# Patient Record
Sex: Female | Born: 1985 | Race: White | Hispanic: No | Marital: Single | Smoking: Former smoker
Health system: Southern US, Community
[De-identification: ages and names within clinical notes are randomized; demographics above are authoritative.]

## PROBLEM LIST (undated history)

## (undated) ENCOUNTER — Inpatient Hospital Stay (HOSPITAL_COMMUNITY): Payer: Self-pay

## (undated) DIAGNOSIS — F5002 Anorexia nervosa, binge eating/purging type: Secondary | ICD-10-CM

## (undated) DIAGNOSIS — K900 Celiac disease: Secondary | ICD-10-CM

## (undated) DIAGNOSIS — F419 Anxiety disorder, unspecified: Secondary | ICD-10-CM

## (undated) DIAGNOSIS — F329 Major depressive disorder, single episode, unspecified: Secondary | ICD-10-CM

## (undated) DIAGNOSIS — F32A Depression, unspecified: Secondary | ICD-10-CM

## (undated) DIAGNOSIS — C73 Malignant neoplasm of thyroid gland: Secondary | ICD-10-CM

## (undated) DIAGNOSIS — E282 Polycystic ovarian syndrome: Secondary | ICD-10-CM

## (undated) DIAGNOSIS — F4310 Post-traumatic stress disorder, unspecified: Secondary | ICD-10-CM

## (undated) DIAGNOSIS — F1021 Alcohol dependence, in remission: Secondary | ICD-10-CM

## (undated) HISTORY — DX: Post-traumatic stress disorder, unspecified: F43.10

## (undated) HISTORY — DX: Celiac disease: K90.0

## (undated) HISTORY — DX: Anorexia nervosa, binge eating/purging type: F50.02

## (undated) HISTORY — PX: TONSILLECTOMY: SUR1361

## (undated) HISTORY — DX: Alcohol dependence, in remission: F10.21

---

## 2009-09-25 DIAGNOSIS — C73 Malignant neoplasm of thyroid gland: Secondary | ICD-10-CM

## 2009-09-25 HISTORY — PX: THYROIDECTOMY: SHX17

## 2009-09-25 HISTORY — DX: Malignant neoplasm of thyroid gland: C73

## 2014-11-23 ENCOUNTER — Encounter (HOSPITAL_COMMUNITY): Payer: Self-pay | Admitting: *Deleted

## 2014-11-23 ENCOUNTER — Inpatient Hospital Stay (HOSPITAL_COMMUNITY)
Admission: AD | Admit: 2014-11-23 | Discharge: 2014-11-23 | Disposition: A | Discharge status: Home / Self Care | Payer: Medicare Other | Source: Ambulatory Visit | Attending: Obstetrics and Gynecology | Admitting: Obstetrics and Gynecology

## 2014-11-23 ENCOUNTER — Inpatient Hospital Stay (HOSPITAL_COMMUNITY): Payer: Medicare Other

## 2014-11-23 DIAGNOSIS — Z87891 Personal history of nicotine dependence: Secondary | ICD-10-CM | POA: Insufficient documentation

## 2014-11-23 DIAGNOSIS — F329 Major depressive disorder, single episode, unspecified: Secondary | ICD-10-CM | POA: Insufficient documentation

## 2014-11-23 DIAGNOSIS — O99281 Endocrine, nutritional and metabolic diseases complicating pregnancy, first trimester: Secondary | ICD-10-CM | POA: Insufficient documentation

## 2014-11-23 DIAGNOSIS — O26899 Other specified pregnancy related conditions, unspecified trimester: Secondary | ICD-10-CM

## 2014-11-23 DIAGNOSIS — Z85850 Personal history of malignant neoplasm of thyroid: Secondary | ICD-10-CM | POA: Insufficient documentation

## 2014-11-23 DIAGNOSIS — F42 Obsessive-compulsive disorder: Secondary | ICD-10-CM | POA: Insufficient documentation

## 2014-11-23 DIAGNOSIS — R109 Unspecified abdominal pain: Secondary | ICD-10-CM | POA: Diagnosis present

## 2014-11-23 DIAGNOSIS — Z3A08 8 weeks gestation of pregnancy: Secondary | ICD-10-CM | POA: Diagnosis not present

## 2014-11-23 DIAGNOSIS — E039 Hypothyroidism, unspecified: Secondary | ICD-10-CM | POA: Insufficient documentation

## 2014-11-23 DIAGNOSIS — O9989 Other specified diseases and conditions complicating pregnancy, childbirth and the puerperium: Secondary | ICD-10-CM

## 2014-11-23 DIAGNOSIS — O208 Other hemorrhage in early pregnancy: Secondary | ICD-10-CM | POA: Insufficient documentation

## 2014-11-23 HISTORY — DX: Anxiety disorder, unspecified: F41.9

## 2014-11-23 HISTORY — DX: Malignant neoplasm of thyroid gland: C73

## 2014-11-23 HISTORY — DX: Major depressive disorder, single episode, unspecified: F32.9

## 2014-11-23 HISTORY — DX: Depression, unspecified: F32.A

## 2014-11-23 HISTORY — DX: Polycystic ovarian syndrome: E28.2

## 2014-11-23 LAB — WET PREP, GENITAL
CLUE CELLS WET PREP: NONE SEEN
TRICH WET PREP: NONE SEEN
Yeast Wet Prep HPF POC: NONE SEEN

## 2014-11-23 LAB — CBC
HCT: 36.9 % (ref 36.0–46.0)
Hemoglobin: 12.6 g/dL (ref 12.0–15.0)
MCH: 29.1 pg (ref 26.0–34.0)
MCHC: 34.1 g/dL (ref 30.0–36.0)
MCV: 85.2 fL (ref 78.0–100.0)
PLATELETS: 239 10*3/uL (ref 150–400)
RBC: 4.33 MIL/uL (ref 3.87–5.11)
RDW: 13.1 % (ref 11.5–15.5)
WBC: 9.5 10*3/uL (ref 4.0–10.5)

## 2014-11-23 LAB — POCT PREGNANCY, URINE: Preg Test, Ur: POSITIVE — AB

## 2014-11-23 LAB — TSH: TSH: 0.085 u[IU]/mL — AB (ref 0.350–4.500)

## 2014-11-23 LAB — URINALYSIS, ROUTINE W REFLEX MICROSCOPIC
Bilirubin Urine: NEGATIVE
GLUCOSE, UA: NEGATIVE mg/dL
HGB URINE DIPSTICK: NEGATIVE
Ketones, ur: NEGATIVE mg/dL
LEUKOCYTES UA: NEGATIVE
Nitrite: NEGATIVE
PH: 6 (ref 5.0–8.0)
Protein, ur: NEGATIVE mg/dL
Specific Gravity, Urine: 1.025 (ref 1.005–1.030)
Urobilinogen, UA: 0.2 mg/dL (ref 0.0–1.0)

## 2014-11-23 LAB — HCG, QUANTITATIVE, PREGNANCY: HCG, BETA CHAIN, QUANT, S: 118602 m[IU]/mL — AB (ref <5)

## 2014-11-23 NOTE — Discharge Instructions (Signed)
First Trimester of Pregnancy The first trimester of pregnancy is from week 1 until the end of week 12 (months 1 through 3). During this time, your baby will begin to develop inside you. At 6-8 weeks, the eyes and face are formed, and the heartbeat can be seen on ultrasound. At the end of 12 weeks, all the baby's organs are formed. Prenatal care is all the medical care you receive before the birth of your baby. Make sure you get good prenatal care and follow all of your doctor's instructions. HOME CARE  Medicines  Take medicine only as told by your doctor. Some medicines are safe and some are not during pregnancy.  Take your prenatal vitamins as told by your doctor.  Take medicine that helps you poop (stool softener) as needed if your doctor says it is okay. Diet  Eat regular, healthy meals.  Your doctor will tell you the amount of weight gain that is right for you.  Avoid raw meat and uncooked cheese.  If you feel sick to your stomach (nauseous) or throw up (vomit):  Eat 4 or 5 small meals a day instead of 3 large meals.  Try eating a few soda crackers.  Drink liquids between meals instead of during meals.  If you have a hard time pooping (constipation):  Eat high-fiber foods like fresh vegetables, fruit, and whole grains.  Drink enough fluids to keep your pee (urine) clear or pale yellow. Activity and Exercise  Exercise only as told by your doctor. Stop exercising if you have cramps or pain in your lower belly (abdomen) or low back.  Try to avoid standing for long periods of time. Move your legs often if you must stand in one place for a long time.  Avoid heavy lifting.  Wear low-heeled shoes. Sit and stand up straight.  You can have sex unless your doctor tells you not to. Relief of Pain or Discomfort  Wear a good support bra if your breasts are sore.  Take warm water baths (sitz baths) to soothe pain or discomfort caused by hemorrhoids. Use hemorrhoid cream if your  doctor says it is okay.  Rest with your legs raised if you have leg cramps or low back pain.  Wear support hose if you have puffy, bulging veins (varicose veins) in your legs. Raise (elevate) your feet for 15 minutes, 3-4 times a day. Limit salt in your diet. Prenatal Care  Schedule your prenatal visits by the twelfth week of pregnancy.  Write down your questions. Take them to your prenatal visits.  Keep all your prenatal visits as told by your doctor. Safety  Wear your seat belt at all times when driving.  Make a list of emergency phone numbers. The list should include numbers for family, friends, the hospital, and police and fire departments. General Tips  Ask your doctor for a referral to a local prenatal class. Begin classes no later than at the start of month 6 of your pregnancy.  Ask for help if you need counseling or help with nutrition. Your doctor can give you advice or tell you where to go for help.  Do not use hot tubs, steam rooms, or saunas.  Do not douche or use tampons or scented sanitary pads.  Do not cross your legs for long periods of time.  Avoid litter boxes and soil used by cats.  Avoid all smoking, herbs, and alcohol. Avoid drugs not approved by your doctor.  Visit your dentist. At home, brush your teeth   with a soft toothbrush. Be gentle when you floss. GET HELP IF:  You are dizzy.  You have mild cramps or pressure in your lower belly.  You have a nagging pain in your belly area.  You continue to feel sick to your stomach, throw up, or have watery poop (diarrhea).  You have a bad smelling fluid coming from your vagina.  You have pain with peeing (urination).  You have increased puffiness (swelling) in your face, hands, legs, or ankles. GET HELP RIGHT AWAY IF:   You have a fever.  You are leaking fluid from your vagina.  You have spotting or bleeding from your vagina.  You have very bad belly cramping or pain.  You gain or lose weight  rapidly.  You throw up blood. It may look like coffee grounds.  You are around people who have German measles, fifth disease, or chickenpox.  You have a very bad headache.  You have shortness of breath.  You have any kind of trauma, such as from a fall or a car accident. Document Released: 02/28/2008 Document Revised: 01/26/2014 Document Reviewed: 07/22/2013 ExitCare Patient Information 2015 [employer]. This information is not intended to replace advice given to you by your health care provider. Make sure you discuss any questions you have with your health care provider.  

## 2014-11-23 NOTE — MAU Provider Note (Signed)
History     CSN: [id number]  Arrival date and time: 11/23/14 1612   First Provider Initiated Contact with Patient 11/23/14 1843      Chief Complaint  Patient presents with  . Possible Pregnancy  . Abdominal Cramping   HPI   Ms. Brittney Chase is a 29 y.o. female G1P0 at [redacted]w[redacted]d who presents with abdominal cramping. She is pregnant and wants to make sure her baby is ok. She also has some concerns regarding some of the medications she is taking.  She is concerned because she had a Thyroidectomy at age 75 due to thyroid CA and is taking medication for this.   She denies vaginal bleeding.   She is currently taking 200 mg of Zoloft daily for depression/ OCD.  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    OB History    Gravida Para Term Preterm AB TAB SAB Ectopic Multiple Living   1               Past Medical History  Diagnosis Date  . PCOS (polycystic ovarian syndrome)   . Papillary carcinoma of thyroid 2011  . Depression   . Anxiety     Past Surgical History  Procedure Laterality Date  . Thyroidectomy  2011    History reviewed. No pertinent family history.  History  Substance Use Topics  . Smoking status: Former Smoker    Quit date: 11/09/2014  . Smokeless tobacco: Never Used  . Alcohol Use: No    Allergies:  Allergies  Allergen Reactions  . Ambien [Zolpidem Tartrate] Other (See Comments)    Causes hallucinations.  . Bacitracin Hives and Itching  . Percocet [Oxycodone-Acetaminophen] Hives, Nausea Only and Swelling  . Shellfish Allergy Hives, Nausea Only and Swelling  . Sudafed [Pseudoephedrine Hcl] Other (See Comments)    Causes hallucinations.    Prescriptions prior to admission  Medication Sig Dispense Refill Last Dose  . Bioflavonoid Products (ESTER C PO) Take 500 mg by mouth at bedtime.   11/22/2014 at Unknown time  . Cyanocobalamin (VITAMIN B-12 PO) Place 1 each under the tongue at bedtime.   11/22/2014 at Unknown time  . docusate sodium (COLACE) 100  MG capsule Take 100 mg by mouth at bedtime.   11/22/2014 at Unknown time  . Prenatal Vit-Fe Fumarate-FA (PRENATAL MULTIVITAMIN) TABS tablet Take 1 tablet by mouth at bedtime.   11/22/2014 at Unknown time  . Probiotic Product (ACIDOPHILUS PEARLS) CAPS Take 2 each by mouth at bedtime.   11/22/2014 at Unknown time  . sertraline (ZOLOFT) 100 MG tablet Take 200 mg by mouth at bedtime.   11/22/2014 at Unknown time  . thyroid (ARMOUR) 65 MG tablet Take 65 mg by mouth 5 (five) times daily.   11/23/2014 at Unknown time   Results for orders placed or performed during the hospital encounter of 11/23/14 (from the past 48 hour(s))  Urinalysis, Routine w reflex microscopic     Status: None   Collection Time: 11/23/14  4:34 PM  Result Value Ref Range   Color, Urine YELLOW YELLOW   APPearance CLEAR CLEAR   Specific Gravity, Urine 1.025 1.005 - 1.030   pH 6.0 5.0 - 8.0   Glucose, UA NEGATIVE NEGATIVE mg/dL   Hgb urine dipstick NEGATIVE NEGATIVE   Bilirubin Urine NEGATIVE NEGATIVE   Ketones, ur NEGATIVE NEGATIVE mg/dL   Protein, ur NEGATIVE NEGATIVE mg/dL   Urobilinogen, UA 0.2 0.0 - 1.0 mg/dL   Nitrite NEGATIVE NEGATIVE   Leukocytes, UA NEGATIVE NEGATIVE  Comment: MICROSCOPIC NOT DONE ON URINES WITH NEGATIVE PROTEIN, BLOOD, LEUKOCYTES, NITRITE, OR GLUCOSE <1000 mg/dL.  Pregnancy, urine POC     Status: Abnormal   Collection Time: 11/23/14  4:45 PM  Result Value Ref Range   Preg Test, Ur POSITIVE (A) NEGATIVE    Comment:        THE SENSITIVITY OF THIS METHODOLOGY IS >24 mIU/mL   CBC     Status: None   Collection Time: 11/23/14  7:10 PM  Result Value Ref Range   WBC 9.5 4.0 - 10.5 K/uL   RBC 4.33 3.87 - 5.11 MIL/uL   Hemoglobin 12.6 12.0 - 15.0 g/dL   HCT 36.9 36.0 - 46.0 %   MCV 85.2 78.0 - 100.0 fL   MCH 29.1 26.0 - 34.0 pg   MCHC 34.1 30.0 - 36.0 g/dL   RDW 13.1 11.5 - 15.5 %   Platelets 239 150 - 400 K/uL  hCG, quantitative, pregnancy     Status: Abnormal   Collection Time: 11/23/14  7:10 PM   Result Value Ref Range   hCG, Beta Chain, Quant, S [id number] (H) <5 mIU/mL    Comment:          GEST. AGE      CONC.  (mIU/mL)   <=1 WEEK        5 - 50     2 WEEKS       50 - 500     3 WEEKS       100 - 10,000     4 WEEKS     1,000 - 30,000     5 WEEKS     3,500 - 115,000   6-8 WEEKS     12,000 - 270,000    12 WEEKS     15,000 - 220,000        FEMALE AND NON-PREGNANT FEMALE:     LESS THAN 5 mIU/mL   Wet prep, genital     Status: Abnormal   Collection Time: 11/23/14  7:25 PM  Result Value Ref Range   Yeast Wet Prep HPF POC NONE SEEN NONE SEEN   Trich, Wet Prep NONE SEEN NONE SEEN   Clue Cells Wet Prep HPF POC NONE SEEN NONE SEEN   WBC, Wet Prep HPF POC MODERATE (A) NONE SEEN    Comment: FEW BACTERIA SEEN    Review of Systems  Constitutional: Positive for chills. Negative for fever.  Gastrointestinal: Positive for nausea and abdominal pain (Lower abdominal pain/ cramping ). Negative for vomiting.   Physical Exam   Blood pressure 132/72, pulse 89, temperature 99.4 F (37.4 C), temperature source Oral, resp. rate 18, height 5\' 4"  (1.626 m), weight 148 lb (67.132 kg), last menstrual period 09/25/2014.  Physical Exam  Constitutional: She is oriented to person, place, and time. She appears well-developed and well-nourished. No distress.  HENT:  Head: Normocephalic.  Eyes: Pupils are equal, round, and reactive to light.  Neck: Neck supple.  Respiratory: Effort normal. No respiratory distress.  GI: Soft. She exhibits no distension. There is tenderness in the right lower quadrant, periumbilical area, suprapubic area and left lower quadrant. There is no rigidity, no rebound and no guarding.  Genitourinary:  Speculum exam: Vagina - Small amount of creamy, white discharge, no odor Cervix - No contact bleeding Bimanual exam: Cervix closed, posterior, no CMT  Uterus non tender, enlarged  Adnexa non tender, no masses bilaterally GC/Chlam, wet prep done Chaperone present for exam.    Musculoskeletal: Normal  range of motion.  Neurological: She is alert and oriented to person, place, and time.  Skin: Skin is warm. She is not diaphoretic.  Psychiatric: Her behavior is normal.    MAU Course  Procedures  None  MDM UA CBC ABO Quant Free T3 Free T4 TSH  HIV  Wet prep GC [place]   Report given to Kerry Hough NP who resumes care of the patient.   Patient takes Armour for hypothyroidism.She took synthroid for years and it made her feel terrible. She is not willing to take synthroid at this time.   Darrelyn Hillock Rasch, NP 11/23/2014 7:47 PM  2000 - Care assumed from Noni Saupe, NP Patient in [place].   US Ob Comp Less 14 Wks  11/23/2014   CLINICAL DATA:  Positive pregnancy test, abdominal pain  EXAM: OBSTETRIC <14 WK [place] AND TRANSVAGINAL OB US  TECHNIQUE: Both transabdominal and transvaginal ultrasound examinations were performed for complete evaluation of the gestation as well as the maternal uterus, adnexal regions, and pelvic cul-de-sac. Transvaginal technique was performed to assess early pregnancy.  COMPARISON:  None.  FINDINGS: Intrauterine gestational sac: Visualized/normal in shape.  Yolk sac:  Visualized  Embryo:  Visualized  Cardiac Activity: Visualized  Heart Rate: 158  bpm  CRL:  17  mm   8 w   2 d                  [place] EDC: 07/03/15  Maternal uterus/adnexae: The ovaries are normal in appearance. Small subchorionic hemorrhage.  IMPRESSION: Single live intrauterine gestation with concordant measurements by crown-rump length compared to assigned gestational age of [redacted] weeks 3 days by LMP.   Electronically Signed   By: Conchita Paris M.D.   On: 11/23/2014 20:51   US Ob Transvaginal  11/23/2014   CLINICAL DATA:  Positive pregnancy test, abdominal pain  EXAM: OBSTETRIC <14 WK [place] AND TRANSVAGINAL OB US  TECHNIQUE: Both transabdominal and transvaginal ultrasound examinations were performed for complete evaluation of the gestation as well as the maternal uterus, adnexal  regions, and pelvic cul-de-sac. Transvaginal technique was performed to assess early pregnancy.  COMPARISON:  None.  FINDINGS: Intrauterine gestational sac: Visualized/normal in shape.  Yolk sac:  Visualized  Embryo:  Visualized  Cardiac Activity: Visualized  Heart Rate: 158  bpm  CRL:  17  mm   8 w   2 d                  [place] EDC: 07/03/15  Maternal uterus/adnexae: The ovaries are normal in appearance. Small subchorionic hemorrhage.  IMPRESSION: Single live intrauterine gestation with concordant measurements by crown-rump length compared to assigned gestational age of [redacted] weeks 3 days by LMP.   Electronically Signed   By: Conchita Paris M.D.   On: 11/23/2014 20:51     Assessment and Plan  A: SIUP at [redacted]w[redacted]d Small subchorionic hemorrhage H/O Thyroidectomy H/O alcohol abuse Abdominal pain in pregnancy  P: Discharge home Tylenol advised PRN for pain  Patient [facility] for prenatal [facility]. They will call her with an appointment Thyroid labs and ABO/Rh pending. Patient is not bleeding today.  Pregnancy confirmation letter given First trimester warning signs discussed Patient may return to MAU as needed or if her condition were to change or worsen   Luvenia Redden, PA-C  11/23/2014 8:58 PM

## 2014-11-23 NOTE — MAU Note (Addendum)
Learned about a wk and a half ago that she was 7 wks ago. Moved back out here from [place]; living with family- wants to make sure baby is ok. Had thyroidectomy 5 yrs ago. Has been hard to control  and regulate, was told this made her high risk.  Is on a high dose of Zoloft.  This is her first.

## 2014-11-24 LAB — ABO/RH: ABO/RH(D): O POS

## 2014-11-24 LAB — T4, FREE: FREE T4: 0.75 ng/dL — AB (ref 0.80–1.80)

## 2014-11-24 LAB — HIV ANTIBODY (ROUTINE TESTING W REFLEX): HIV Screen 4th Generation wRfx: NONREACTIVE

## 2014-11-25 LAB — GC/CHLAMYDIA PROBE AMP (~~LOC~~) NOT AT ARMC
Chlamydia: NEGATIVE
NEISSERIA GONORRHEA: NEGATIVE

## 2014-11-25 LAB — T3, FREE: T3, Free: 4.1 pg/mL (ref 2.0–4.4)

## 2014-11-25 LAB — T3, REVERSE: T3, Reverse: 15 ng/dL (ref 8–25)

## 2014-12-17 ENCOUNTER — Encounter: Payer: Self-pay | Admitting: Obstetrics & Gynecology

## 2014-12-17 ENCOUNTER — Ambulatory Visit (INDEPENDENT_AMBULATORY_CARE_PROVIDER_SITE_OTHER): Payer: Medicare Other | Admitting: Obstetrics & Gynecology

## 2014-12-17 ENCOUNTER — Other Ambulatory Visit (HOSPITAL_COMMUNITY)
Admission: RE | Admit: 2014-12-17 | Discharge: 2014-12-17 | Disposition: A | Discharge status: Home / Self Care | Payer: Medicare Other | Source: Ambulatory Visit | Attending: Obstetrics & Gynecology | Admitting: Obstetrics & Gynecology

## 2014-12-17 ENCOUNTER — Other Ambulatory Visit: Payer: Self-pay | Admitting: Obstetrics & Gynecology

## 2014-12-17 VITALS — BP 101/70 | HR 73 | Temp 98.4°F | Wt 157.9 lb

## 2014-12-17 DIAGNOSIS — E039 Hypothyroidism, unspecified: Secondary | ICD-10-CM | POA: Diagnosis not present

## 2014-12-17 DIAGNOSIS — Z85850 Personal history of malignant neoplasm of thyroid: Secondary | ICD-10-CM

## 2014-12-17 DIAGNOSIS — R87619 Unspecified abnormal cytological findings in specimens from cervix uteri: Secondary | ICD-10-CM | POA: Insufficient documentation

## 2014-12-17 DIAGNOSIS — Z3401 Encounter for supervision of normal first pregnancy, first trimester: Secondary | ICD-10-CM

## 2014-12-17 DIAGNOSIS — Z1151 Encounter for screening for human papillomavirus (HPV): Secondary | ICD-10-CM | POA: Insufficient documentation

## 2014-12-17 DIAGNOSIS — Z124 Encounter for screening for malignant neoplasm of cervix: Secondary | ICD-10-CM | POA: Insufficient documentation

## 2014-12-17 DIAGNOSIS — Z3682 Encounter for antenatal screening for nuchal translucency: Secondary | ICD-10-CM

## 2014-12-17 DIAGNOSIS — F5002 Anorexia nervosa, binge eating/purging type: Secondary | ICD-10-CM | POA: Insufficient documentation

## 2014-12-17 DIAGNOSIS — Z23 Encounter for immunization: Secondary | ICD-10-CM

## 2014-12-17 LAB — POCT URINALYSIS DIP (DEVICE)
BILIRUBIN URINE: NEGATIVE
Glucose, UA: NEGATIVE mg/dL
Hgb urine dipstick: NEGATIVE
Ketones, ur: NEGATIVE mg/dL
LEUKOCYTES UA: NEGATIVE
Nitrite: NEGATIVE
Protein, ur: NEGATIVE mg/dL
Urobilinogen, UA: 0.2 mg/dL (ref 0.0–1.0)
pH: 5.5 (ref 5.0–8.0)

## 2014-12-17 NOTE — Addendum Note (Signed)
Addended by: Excell Seltzer on: 12/17/2014 01:42 PM   Modules accepted: Orders

## 2014-12-17 NOTE — Progress Notes (Signed)
First Trimester Screen with Garrett 12/24/14 @1130a .

## 2014-12-17 NOTE — Progress Notes (Signed)
Nutrition note: 1st visit consult Pt has gained 13.9# @ [redacted]w[redacted]d, which is > expected. Pt reports this is due to her lack of thyroid & now taking T4. Pt reports eating 2 meals & 2-3 snacks/d. Pt is taking a PNV as well as multiple supplements (including B12, fish oil, probiotic). Pt reports having N&V and that eating helps decrease this. Pt reports no heartburn. Pt is allergic to shellfish & has celiac dx (pt reports biopsy confirmation when ~16y). Pt received verbal & written education on general nutrition during pregnancy. Discussed tips to decrease N&V. Discussed wt gain goals of 25-35# or 1#/wk. Pt agrees to continue taking PNV. Pt has [place] in [facility] & plans to BF. F/u as needed Vladimir Faster, MS, RD, LDN, [facility]

## 2014-12-17 NOTE — Patient Instructions (Signed)
First Trimester of Pregnancy The first trimester of pregnancy is from week 1 until the end of week 12 (months 1 through 3). A week after a sperm fertilizes an egg, the egg will implant on the wall of the uterus. This embryo will begin to develop into a baby. Genes from you and your partner are forming the baby. The female genes determine whether the baby is a boy or a girl. At 6-8 weeks, the eyes and face are formed, and the heartbeat can be seen on ultrasound. At the end of 12 weeks, all the baby's organs are formed.  Now that you are pregnant, you will want to do everything you can to have a healthy baby. Two of the most important things are to get good prenatal care and to follow your health care provider's instructions. Prenatal care is all the medical care you receive before the baby's birth. This care will help prevent, find, and treat any problems during the pregnancy and childbirth. BODY CHANGES Your body goes through many changes during pregnancy. The changes vary from woman to woman.   You may gain or lose a couple of pounds at first.  You may feel sick to your stomach (nauseous) and throw up (vomit). If the vomiting is uncontrollable, call your health care provider.  You may tire easily.  You may develop headaches that can be relieved by medicines approved by your health care provider.  You may urinate more often. Painful urination may mean you have a bladder infection.  You may develop heartburn as a result of your pregnancy.  You may develop constipation because certain hormones are causing the muscles that push waste through your intestines to slow down.  You may develop hemorrhoids or swollen, bulging veins (varicose veins).  Your breasts may begin to grow larger and become tender. Your nipples may stick out more, and the tissue that surrounds them (areola) may become darker.  Your gums may bleed and may be sensitive to brushing and flossing.  Dark spots or blotches (chloasma,  mask of pregnancy) may develop on your face. This will likely fade after the baby is born.  Your menstrual periods will stop.  You may have a loss of appetite.  You may develop cravings for certain kinds of food.  You may have changes in your emotions from day to day, such as being excited to be pregnant or being concerned that something may go wrong with the pregnancy and baby.  You may have more vivid and strange dreams.  You may have changes in your hair. These can include thickening of your hair, rapid growth, and changes in texture. Some women also have hair loss during or after pregnancy, or hair that feels dry or thin. Your hair will most likely return to normal after your baby is born. WHAT TO EXPECT AT YOUR PRENATAL VISITS During a routine prenatal visit:  You will be weighed to make sure you and the baby are growing normally.  Your blood pressure will be taken.  Your abdomen will be measured to track your baby's growth.  The fetal heartbeat will be listened to starting around week 10 or 12 of your pregnancy.  Test results from any previous visits will be discussed. Your health care provider may ask you:  How you are feeling.  If you are feeling the baby move.  If you have had any abnormal symptoms, such as leaking fluid, bleeding, severe headaches, or abdominal cramping.  If you have any questions. Other tests   that may be performed during your first trimester include:  Blood tests to find your blood type and to check for the presence of any previous infections. They will also be used to check for low iron levels (anemia) and Rh antibodies. Later in the pregnancy, blood tests for diabetes will be done along with other tests if problems develop.  Urine tests to check for infections, diabetes, or protein in the urine.  An ultrasound to confirm the proper growth and development of the baby.  An amniocentesis to check for possible genetic problems.  Fetal screens for  spina bifida and Down syndrome.  You may need other tests to make sure you and the baby are doing well. HOME CARE INSTRUCTIONS  Medicines  Follow your health care provider's instructions regarding medicine use. Specific medicines may be either safe or unsafe to take during pregnancy.  Take your prenatal vitamins as directed.  If you develop constipation, try taking a stool softener if your health care provider approves. Diet  Eat regular, well-balanced meals. Choose a variety of foods, such as meat or vegetable-based protein, fish, milk and low-fat dairy products, vegetables, fruits, and whole grain breads and cereals. Your health care provider will help you determine the amount of weight gain that is right for you.  Avoid raw meat and uncooked cheese. These carry germs that can cause birth defects in the baby.  Eating four or five small meals rather than three large meals a day may help relieve nausea and vomiting. If you start to feel nauseous, eating a few soda crackers can be helpful. Drinking liquids between meals instead of during meals also seems to help nausea and vomiting.  If you develop constipation, eat more high-fiber foods, such as fresh vegetables or fruit and whole grains. Drink enough fluids to keep your urine clear or pale yellow. Activity and Exercise  Exercise only as directed by your health care provider. Exercising will help you:  Control your weight.  Stay in shape.  Be prepared for labor and delivery.  Experiencing pain or cramping in the lower abdomen or low back is a good sign that you should stop exercising. Check with your health care provider before continuing normal exercises.  Try to avoid standing for long periods of time. Move your legs often if you must stand in one place for a long time.  Avoid heavy lifting.  Wear low-heeled shoes, and practice good posture.  You may continue to have sex unless your health care provider directs you  otherwise. Relief of Pain or Discomfort  Wear a good support bra for breast tenderness.   Take warm sitz baths to soothe any pain or discomfort caused by hemorrhoids. Use hemorrhoid cream if your health care provider approves.   Rest with your legs elevated if you have leg cramps or low back pain.  If you develop varicose veins in your legs, wear support hose. Elevate your feet for 15 minutes, 3-4 times a day. Limit salt in your diet. Prenatal Care  Schedule your prenatal visits by the twelfth week of pregnancy. They are usually scheduled monthly at first, then more often in the last 2 months before delivery.  Write down your questions. Take them to your prenatal visits.  Keep all your prenatal visits as directed by your health care provider. Safety  Wear your seat belt at all times when driving.  Make a list of emergency phone numbers, including numbers for family, friends, the hospital, and police and fire departments. General Tips    Ask your health care provider for a referral to a local prenatal education class. Begin classes no later than at the beginning of month 6 of your pregnancy.  Ask for help if you have counseling or nutritional needs during pregnancy. Your health care provider can offer advice or refer you to specialists for help with various needs.  Do not use hot tubs, steam rooms, or saunas.  Do not douche or use tampons or scented sanitary pads.  Do not cross your legs for long periods of time.  Avoid cat litter boxes and soil used by cats. These carry germs that can cause birth defects in the baby and possibly loss of the fetus by miscarriage or stillbirth.  Avoid all smoking, herbs, alcohol, and medicines not prescribed by your health care provider. Chemicals in these affect the formation and growth of the baby.  Schedule a dentist appointment. At home, brush your teeth with a soft toothbrush and be gentle when you floss. SEEK MEDICAL CARE IF:   You have  dizziness.  You have mild pelvic cramps, pelvic pressure, or nagging pain in the abdominal area.  You have persistent nausea, vomiting, or diarrhea.  You have a bad smelling vaginal discharge.  You have pain with urination.  You notice increased swelling in your face, hands, legs, or ankles. SEEK IMMEDIATE MEDICAL CARE IF:   You have a fever.  You are leaking fluid from your vagina.  You have spotting or bleeding from your vagina.  You have severe abdominal cramping or pain.  You have rapid weight gain or loss.  You vomit blood or material that looks like coffee grounds.  You are exposed to German measles and have never had them.  You are exposed to fifth disease or chickenpox.  You develop a severe headache.  You have shortness of breath.  You have any kind of trauma, such as from a fall or a car accident. Document Released: 09/05/2001 Document Revised: 01/26/2014 Document Reviewed: 07/22/2013 Exit[employer] Patient Information 2015 [employer]. This information is not intended to replace advice given to you by your health care provider. Make sure you discuss any questions you have with your health care provider.  
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
## 2014-12-17 NOTE — Progress Notes (Signed)
   Subjective:new OB    Brittney Chase is a G1P0 [redacted]w[redacted]d being seen today for her first obstetrical visit.  Her obstetrical history is significant for hypothyroid. Patient does intend to breast feed. Pregnancy history fully reviewed.  Patient reports no complaints.  Filed Vitals:   12/17/14 1026  BP: 101/70  Pulse: 73  Temp: 98.4 F (36.9 C)  Weight: 157 lb 14.4 oz (71.623 kg)    HISTORY: OB History  Gravida Para Term Preterm AB SAB TAB Ectopic Multiple Living  1             # Outcome Date GA Lbr Len/2nd Weight Sex Delivery Anes PTL Lv  1 Current              Past Medical History  Diagnosis Date  . PCOS (polycystic ovarian syndrome)   . Papillary carcinoma of thyroid 2011  . Depression   . Anxiety   . Celiac disease     when patient was 73  . Anorexia nervosa, binge eating/purging type     up until pregnancy   . History of alcoholism     quit two years ago  . PTSD (post-traumatic stress disorder)    Past Surgical History  Procedure Laterality Date  . Thyroidectomy  2011  . Tonsillectomy     Family History  Problem Relation Age of Onset  . Diabetes Mother     prediabetic  . Hyperlipidemia Mother   . Hypertension Mother   . Depression Mother   . Asthma Mother   . COPD Father   . Alcohol abuse Father   . Bipolar disorder Father   . Depression Father   . Hyperlipidemia Father   . Hypertension Father      Exam    Uterus:     Pelvic Exam:    Perineum: No Hemorrhoids   Vulva: normal   Vagina:  normal mucosa   pH:     Cervix: no lesions   Adnexa: normal adnexa   Bony Pelvis: average  System: Breast:  normal appearance, no masses or tenderness   Skin: normal coloration and turgor, no rashes    Neurologic: oriented, normal mood   Extremities: normal strength, tone, and muscle mass   HEENT thyroid without masses   Mouth/Teeth dental hygiene good   Neck supple and no masses   Cardiovascular: regular rate and rhythm, no murmurs or gallops   Respiratory:  appears well, vitals normal, no respiratory distress, acyanotic, normal RR   Abdomen: soft, non-tender; bowel sounds normal; no masses,  no organomegaly   Urinary: urethral meatus normal      Assessment:    Pregnancy: G1P0 Patient Active Problem List   Diagnosis Date Noted  . Supervision of normal first pregnancy in first trimester 12/17/2014  . History of thyroid cancer 12/17/2014  . Hypothyroidism (acquired) 12/17/2014  . Anorexia nervosa, binge eating/purging type         Plan:     Initial labs drawn. Prenatal vitamins. Problem list reviewed and updated. Genetic Screening discussed First Screen: ordered.  Ultrasound discussed; fetal survey: 18+WEEKS.  Follow up in 4 weeks. 50% of 30 min visit spent on counseling and coordination of care.  Has endocriniologist   Brittney Chase 12/17/2014

## 2014-12-18 LAB — PRENATAL PROFILE (SOLSTAS)
Antibody Screen: NEGATIVE
Basophils Absolute: 0 10*3/uL (ref 0.0–0.1)
Basophils Relative: 0 % (ref 0–1)
EOS PCT: 1 % (ref 0–5)
Eosinophils Absolute: 0.1 10*3/uL (ref 0.0–0.7)
HCT: 38.4 % (ref 36.0–46.0)
HEMOGLOBIN: 12.8 g/dL (ref 12.0–15.0)
HIV 1&2 Ab, 4th Generation: NONREACTIVE
Hepatitis B Surface Ag: NEGATIVE
LYMPHS ABS: 1.6 10*3/uL (ref 0.7–4.0)
Lymphocytes Relative: 19 % (ref 12–46)
MCH: 29.7 pg (ref 26.0–34.0)
MCHC: 33.3 g/dL (ref 30.0–36.0)
MCV: 89.1 fL (ref 78.0–100.0)
MONO ABS: 0.4 10*3/uL (ref 0.1–1.0)
MPV: 9.7 fL (ref 8.6–12.4)
Monocytes Relative: 5 % (ref 3–12)
Neutro Abs: 6.2 10*3/uL (ref 1.7–7.7)
Neutrophils Relative %: 75 % (ref 43–77)
Platelets: 233 10*3/uL (ref 150–400)
RBC: 4.31 MIL/uL (ref 3.87–5.11)
RDW: 15.2 % (ref 11.5–15.5)
RUBELLA: 1.69 {index} — AB (ref <0.90)
Rh Type: POSITIVE
WBC: 8.2 10*3/uL (ref 4.0–10.5)

## 2014-12-18 LAB — CULTURE, OB URINE
Colony Count: NO GROWTH
Organism ID, Bacteria: NO GROWTH

## 2014-12-19 LAB — PRESCRIPTION MONITORING PROFILE (19 PANEL)
Amphetamine/Meth: NEGATIVE ng/mL
BARBITURATE SCREEN, URINE: NEGATIVE ng/mL
Benzodiazepine Screen, Urine: NEGATIVE ng/mL
Buprenorphine, Urine: NEGATIVE ng/mL
CANNABINOID SCRN UR: NEGATIVE ng/mL
COCAINE METABOLITES: NEGATIVE ng/mL
Carisoprodol, Urine: NEGATIVE ng/mL
Creatinine, Urine: 14.85 mg/dL — ABNORMAL LOW (ref >20.0)
Fentanyl, Ur: NEGATIVE ng/mL
MDMA URINE: NEGATIVE ng/mL
METHAQUALONE SCREEN (URINE): NEGATIVE ng/mL
Meperidine, Ur: NEGATIVE ng/mL
Methadone Screen, Urine: NEGATIVE ng/mL
Nitrites, Initial: NEGATIVE ug/mL
Opiate Screen, Urine: NEGATIVE ng/mL
Oxycodone Screen, Ur: NEGATIVE ng/mL
PH URINE, INITIAL: 6.7 pH (ref 4.5–8.9)
PHENCYCLIDINE, UR: NEGATIVE ng/mL
Propoxyphene: NEGATIVE ng/mL
Tapentadol, urine: NEGATIVE ng/mL
Tramadol Scrn, Ur: NEGATIVE ng/mL
Zolpidem, Urine: NEGATIVE ng/mL

## 2014-12-21 LAB — CYTOLOGY - PAP

## 2014-12-24 ENCOUNTER — Other Ambulatory Visit (HOSPITAL_COMMUNITY): Payer: Medicare Other

## 2014-12-24 ENCOUNTER — Ambulatory Visit (HOSPITAL_COMMUNITY): Payer: Medicare Other

## 2014-12-24 ENCOUNTER — Encounter: Payer: Self-pay | Admitting: Obstetrics & Gynecology

## 2014-12-24 DIAGNOSIS — E039 Hypothyroidism, unspecified: Secondary | ICD-10-CM | POA: Insufficient documentation

## 2014-12-24 DIAGNOSIS — O99280 Endocrine, nutritional and metabolic diseases complicating pregnancy, unspecified trimester: Secondary | ICD-10-CM

## 2014-12-31 ENCOUNTER — Telehealth: Payer: Self-pay

## 2014-12-31 NOTE — Telephone Encounter (Signed)
-----   Message from Mallie Darting sent at 12/31/2014  3:13 PM EDT ----- Patient is coming in with Kennon Rounds on her ob appointment day. You still may want to call her, and inform her of this procedure ----- Message -----    From: Ronnell Freshwater Day, RN    Sent: 12/29/2014   8:42 AM      To: [facility] Admin Pool  Please schedule Colpo appt and send back to clinical pool. We will call the pt.  Thanks  ----- Message -----    From: Woodroe Mode, MD    Sent: 12/28/2014   4:45 PM      To: [facility] Clinical Pool  LSIL needs colposcopy
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
## 2015-01-14 ENCOUNTER — Encounter: Payer: Medicare Other | Admitting: Family Medicine

## 2015-01-14 ENCOUNTER — Encounter: Payer: Medicare Other | Admitting: Obstetrics and Gynecology

## 2015-09-28 ENCOUNTER — Encounter (HOSPITAL_COMMUNITY): Payer: Self-pay | Admitting: *Deleted

## 2015-12-26 IMAGING — US US OB TRANSVAGINAL
1 series · 14 of 28 positions shown · non-contrast
Comparison: None.

CLINICAL DATA: Positive pregnancy test, abdominal pain

EXAM:
OBSTETRIC <14 WK US AND TRANSVAGINAL OB US
TECHNIQUE: Both transabdominal and transvaginal ultrasound examinations were
performed for complete evaluation of the gestation as well as the
maternal uterus, adnexal regions, and pelvic cul-de-sac.
Transvaginal technique was performed to assess early pregnancy.

[Series 1: us ob comp less 14 wks · 14 of 80 slices shown]
[im 3/80]
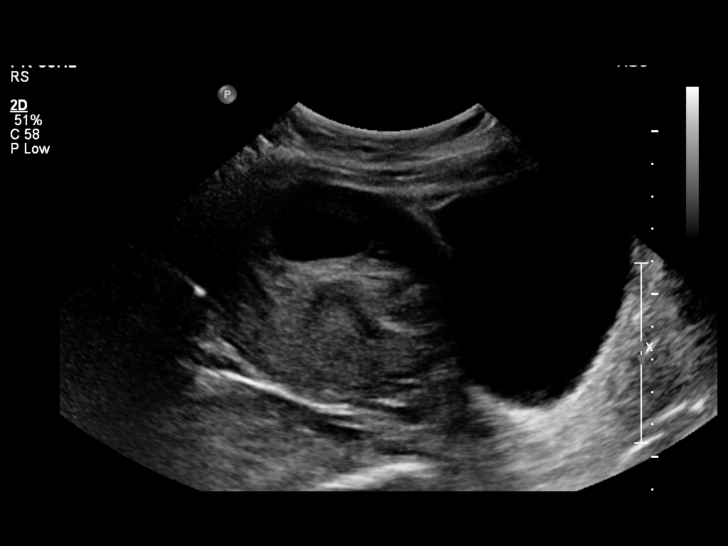
[im 9/80]
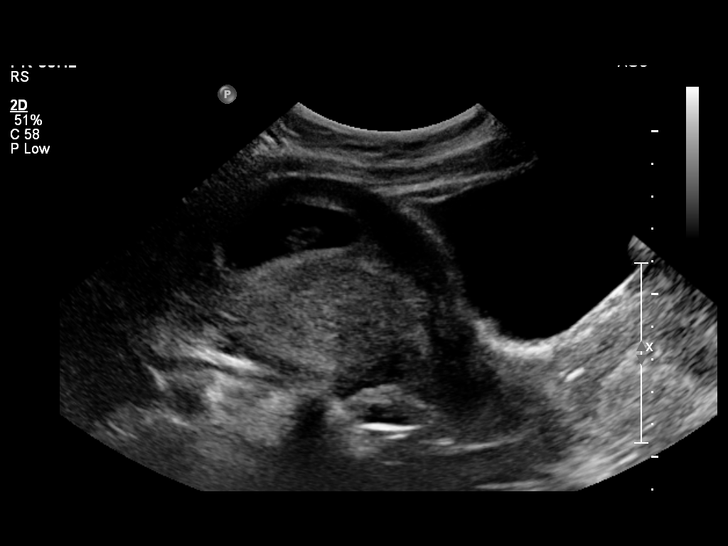
[im 15/80]
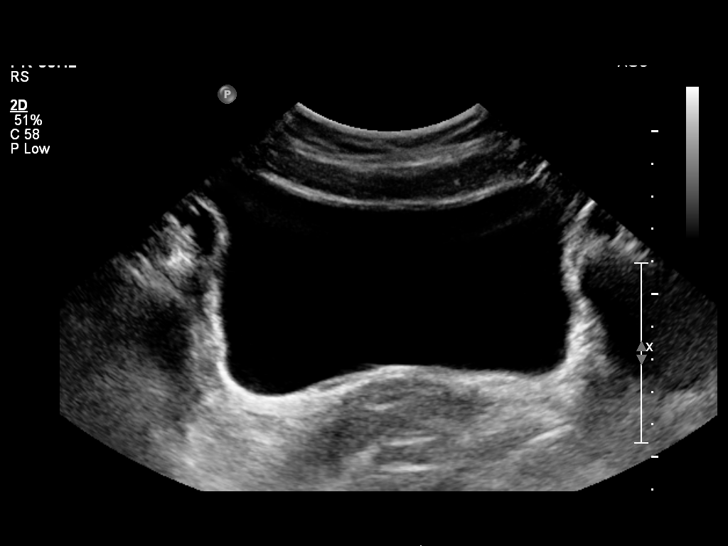
[im 21/80]
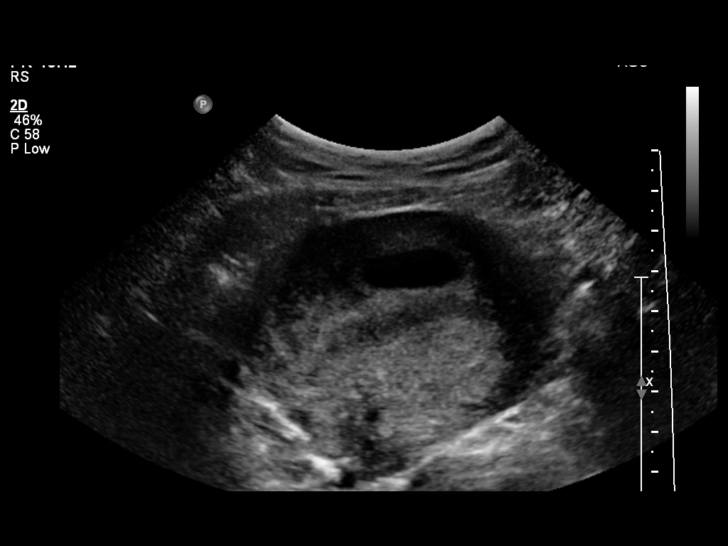
[im 27/80]
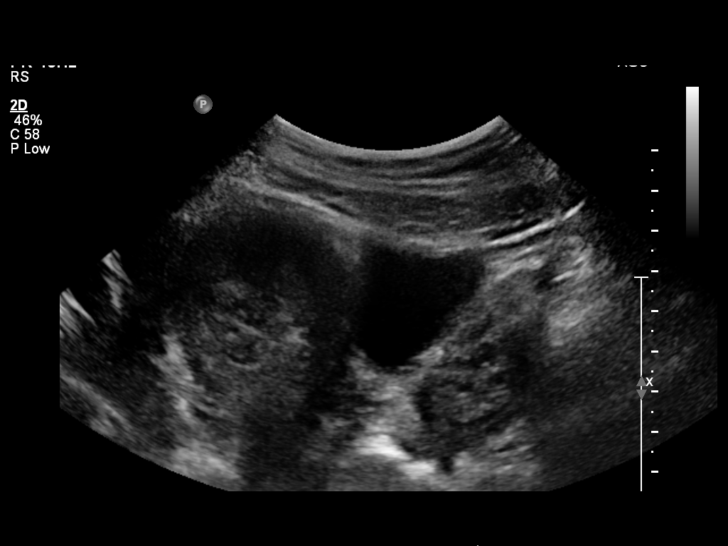
[im 33/80]
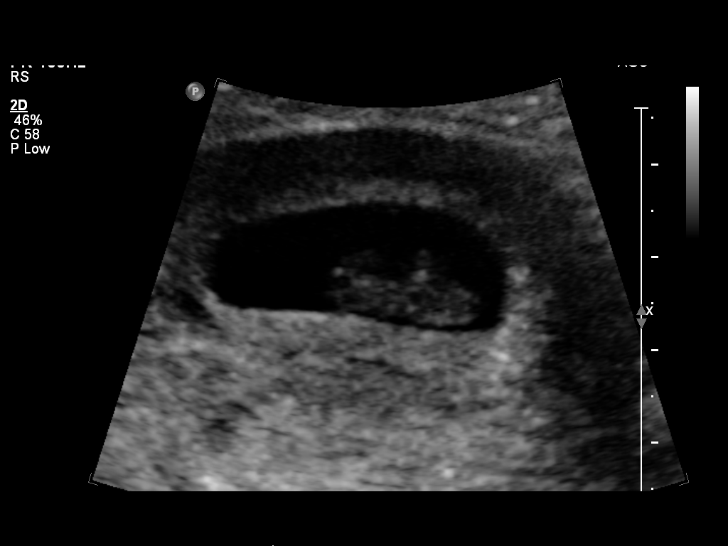
[im 39/80]
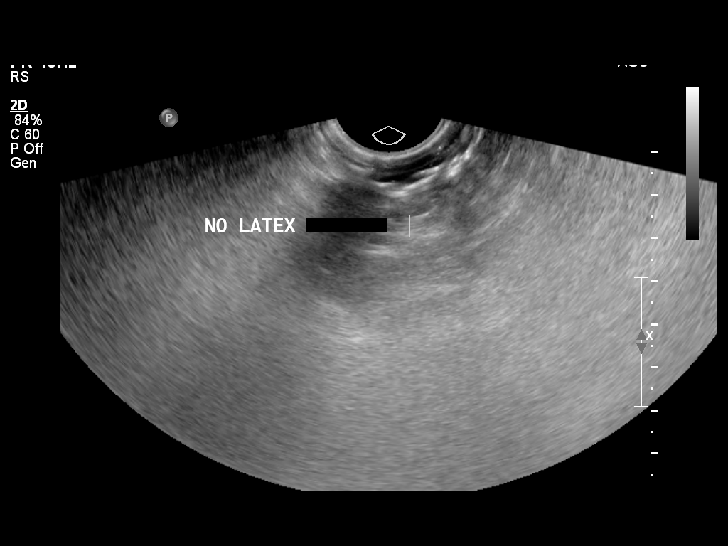
[im 44/80]
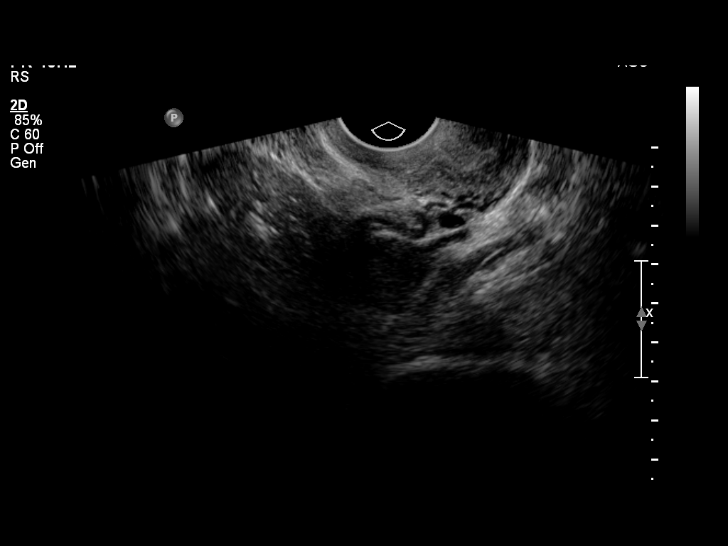
[im 50/80]
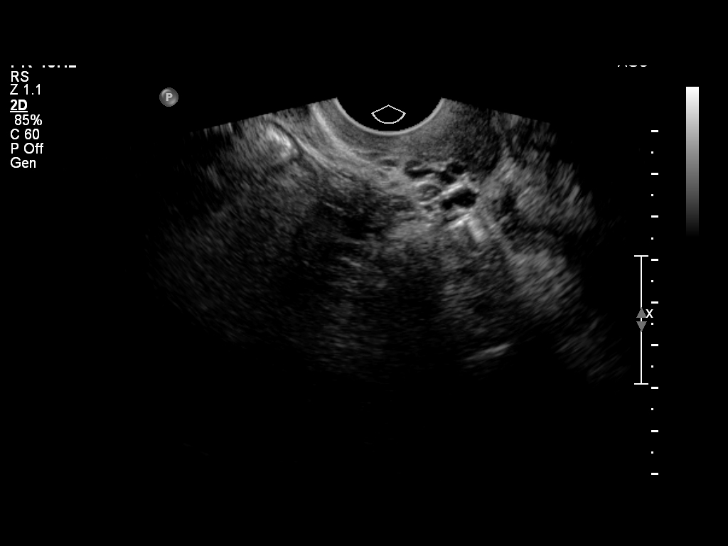
[im 56/80]
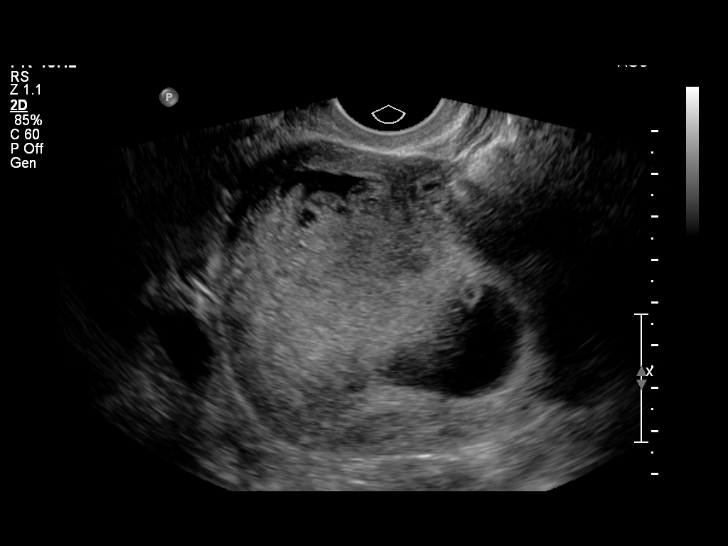
[im 62/80]
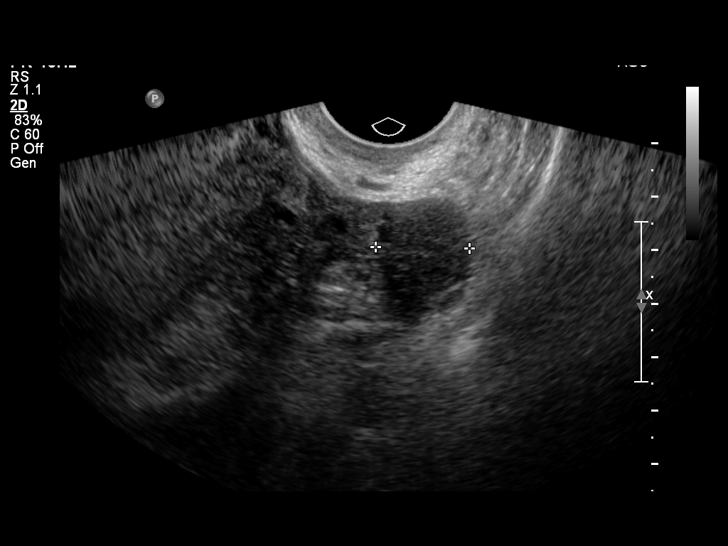
[im 68/80]
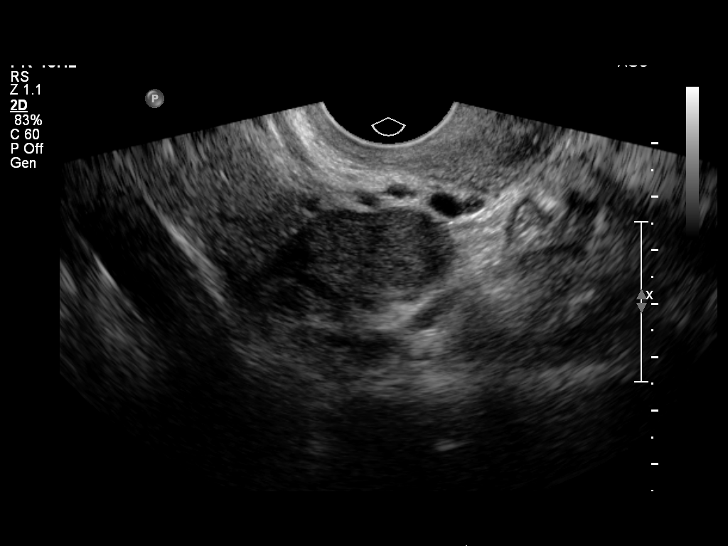
[im 74/80]
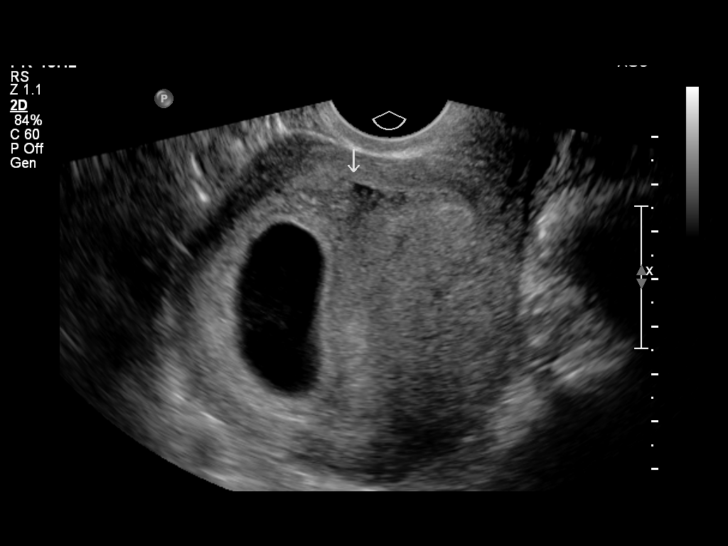
[im 80/80]
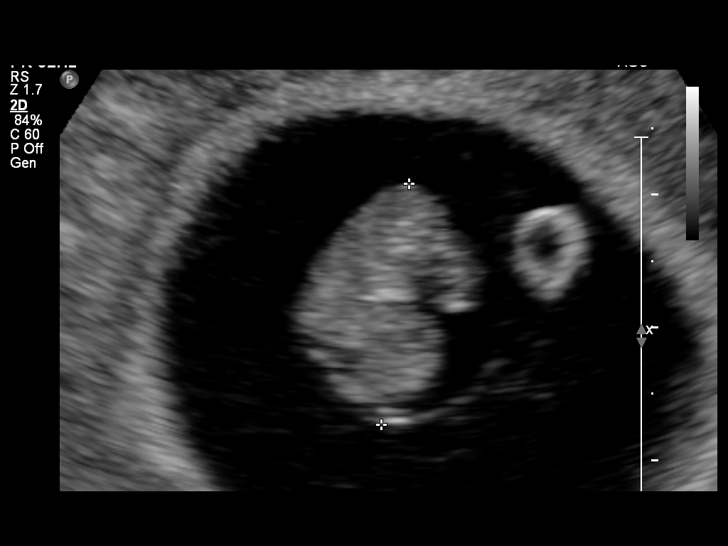

[14 of 28 positions shown; findings below may reference images not displayed]

FINDINGS: Intrauterine gestational sac: Visualized/normal in shape.

Yolk sac:  Visualized

Embryo:  Visualized

Cardiac Activity: Visualized

Heart Rate: 158  bpm

CRL:  17  mm   8 w   2 d                  US EDC: 07/03/15

Maternal uterus/adnexae: The ovaries are normal in appearance. Small
subchorionic hemorrhage.
IMPRESSION: Single live intrauterine gestation with concordant measurements by
crown-rump length compared to assigned gestational age of 8 weeks 3
days by LMP.
